# Patient Record
Sex: Female | Born: 1965 | Race: White | Hispanic: No | Marital: Married | Smoking: Never smoker
Health system: Southern US, Community
[De-identification: ages and names within clinical notes are randomized; demographics above are authoritative.]

## PROBLEM LIST (undated history)

## (undated) DIAGNOSIS — K219 Gastro-esophageal reflux disease without esophagitis: Secondary | ICD-10-CM

## (undated) DIAGNOSIS — I10 Essential (primary) hypertension: Secondary | ICD-10-CM

## (undated) DIAGNOSIS — F419 Anxiety disorder, unspecified: Secondary | ICD-10-CM

## (undated) DIAGNOSIS — IMO0001 Reserved for inherently not codable concepts without codable children: Secondary | ICD-10-CM

## (undated) HISTORY — DX: Anxiety disorder, unspecified: F41.9

## (undated) HISTORY — DX: Essential (primary) hypertension: I10

## (undated) HISTORY — DX: Reserved for inherently not codable concepts without codable children: IMO0001

## (undated) HISTORY — DX: Gastro-esophageal reflux disease without esophagitis: K21.9

## (undated) HISTORY — PX: APPENDECTOMY: SHX54

---

## 2016-06-28 ENCOUNTER — Ambulatory Visit (INDEPENDENT_AMBULATORY_CARE_PROVIDER_SITE_OTHER): Payer: BLUE CROSS/BLUE SHIELD | Admitting: Pulmonary Disease

## 2016-06-28 ENCOUNTER — Ambulatory Visit (INDEPENDENT_AMBULATORY_CARE_PROVIDER_SITE_OTHER)
Admission: RE | Admit: 2016-06-28 | Discharge: 2016-06-28 | Disposition: A | Discharge status: Home / Self Care | Payer: BLUE CROSS/BLUE SHIELD | Source: Ambulatory Visit | Attending: Pulmonary Disease | Admitting: Pulmonary Disease

## 2016-06-28 ENCOUNTER — Encounter: Payer: Self-pay | Admitting: Pulmonary Disease

## 2016-06-28 VITALS — BP 156/90 | HR 75 | Ht 68.0 in | Wt 290.0 lb

## 2016-06-28 DIAGNOSIS — R0689 Other abnormalities of breathing: Secondary | ICD-10-CM

## 2016-06-28 DIAGNOSIS — R0600 Dyspnea, unspecified: Secondary | ICD-10-CM

## 2016-06-28 MED ORDER — BUDESONIDE-FORMOTEROL FUMARATE 80-4.5 MCG/ACT IN AERO: 2.0000 | INHALATION_SPRAY | Freq: Two times a day (BID) | RESPIRATORY_TRACT | 0 refills | Status: DC

## 2016-06-28 NOTE — Patient Instructions (Signed)
We will get a chest x-ray today and start you on Symbicort inhaler. We will need to get records of all the outpatient workup from Dr. Mikey BussingHoffman.  Return to clinic in 1 month to review these results and plan for further workup as needed.

## 2016-06-28 NOTE — Progress Notes (Signed)
Patient seen in the office today and instructed on use of Symbicort 80-4.635mcg.  Patient expressed understanding and demonstrated technique. Boone MasterJessica Jones, [facility]CMA 06/28/16

## 2016-06-28 NOTE — Progress Notes (Signed)
Nichole Savage    [id number]    06-Jul-1966  Primary Care Physician:HOFFMAN,BYRON, MD  Referring Physician: No referring provider defined for this encounter.  Chief complaint:  Consult for evaluation of dyspnea.  HPI: Nichole Savage is a 49 year old with past medical history of anxiety, hypertension. She complains of dyspnea for the past 1 year. This occurs both at exertion and at rest it is associated with wheezing, cough with daily mucus production which is white/yellow in consistency. She is a lifelong nonsmoker. [place] and has been inactive, gained about 120 pounds since then. She states had a recent diagnosis of sleep apnea and is waiting to be started on CPAP therapy. She's had an evaluation by cardiology for dyspnea and reports a normal studies. She also reportedly had pulmonary function tests last month. I do not have these reports to review.  She does not have any symptoms of sinusitis, allergies, postnasal drip, GERD. She is a lifelong nonsmoker. 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 She does not have any pets such as cats, dogs, birds, farm animals. Her son used to own a pet rabbit but not for the past 5 years. She does not have any mold issues at home, no hot tubs, no carpets.   Outpatient Encounter Prescriptions as of 06/28/2016  Medication Sig  . clonazePAM (KLONOPIN) 0.5 MG tablet Take 0.5 mg by mouth daily as needed.  Marland Kitchen lisinopril (PRINIVIL,ZESTRIL) 5 MG tablet Take 5 mg by mouth daily.  Marland Kitchen PARoxetine (PAXIL) 40 MG tablet Take 40 mg by mouth daily.  . triazolam (HALCION) 0.25 MG tablet Take before dental appointments   No facility-administered encounter medications on file as of 06/28/2016.     Allergies as of 06/28/2016 - Review Complete 06/28/2016  Allergen Reaction Noted  . Amoxicillin Itching 05/03/2013    Past Medical History:  Diagnosis  Date  . Anxiety   . High blood pressure   . Reflux     Past Surgical History:  Procedure Laterality Date  . APPENDECTOMY      History reviewed. No pertinent family history.  Social History   Social History  . Marital status: Married    Spouse name: [name]  . Number of children: [name]  . Years of education: [name]   Occupational History  . Not on file.   Social History Main Topics  . Smoking status: Never Smoker  . Smokeless tobacco: Never Used  . Alcohol use No  . Drug use: Unknown  . Sexual activity: Not on file   Other Topics Concern  . Not on file   Social History Narrative  . No narrative on file     Review of systems: Review of Systems  Constitutional: Negative for fever and chills.  HENT: Negative.   Eyes: Negative for blurred vision.  Respiratory: as per HPI  Cardiovascular: Negative for chest pain and palpitations.  Gastrointestinal: Negative for vomiting, diarrhea, blood per rectum. Genitourinary: Negative for dysuria, urgency, frequency and hematuria.  Musculoskeletal: Negative for myalgias, back pain and joint pain.  Skin: Negative for itching and rash.  Neurological: Negative for dizziness, tremors, focal weakness, seizures and loss of consciousness.  Endo/Heme/Allergies: Negative for environmental allergies.  Psychiatric/Behavioral: Negative for depression, suicidal ideas and hallucinations.  All other systems reviewed and are negative.   Physical Exam: Pulse 75, height 5\' 8"  (1.727 m), weight  290 lb (131.5 kg), SpO2 97 %. Gen:      No acute distress, Obese HEENT:  EOMI, sclera anicteric Neck:     No masses; no thyromegaly Lungs:    Clear to auscultation bilaterally; normal respiratory effort CV:         Regular rate and rhythm; no murmurs Abd:      + bowel sounds; soft, non-tender; no palpable masses, no distension Ext:    No edema; adequate peripheral perfusion Skin:      Warm and dry; no rash Neuro: alert and oriented x 3 Psych: normal mood  and affect  Data Reviewed:   Assessment:  Evaluation of dyspnea on exertion.  Her symptoms sound like chronic bronchitis. She may have a component of COPD however she is a lifelong nonsmoker. There is no suggestion of asthma, allergic component to this. I suspect her weight gain over the past 10 years is contributing to her symptoms. She's had a cardiac eval and pulmonary function tests recently. I'll try to obtain this from her primary care physician. She will get a chest x-ray and started empirically on Symbicort inhaler.  If her symptoms continue unabated we can consider a cardiopulmonary exercise test for further investigation.  Plan/Recommendations: - Obtain records of cardiac eval and PFTs from PCP - Check CXR - Start symbicort.  Return in 1 month.  Chilton GreathousePraveen Mekiah Cambridge MD [facility] Pager [phone] 06/28/2016, 4:26 PM  CC: No ref. provider found

## 2016-07-02 NOTE — Progress Notes (Signed)
ATC pt, automated message that voicemail has not been set up yet.  WCB.

## 2016-07-12 NOTE — Progress Notes (Signed)
ATC pt x3 - no answer and VM has not been set up yet.  Pt is active in MyChart, results released to her.

## 2016-08-03 ENCOUNTER — Ambulatory Visit: Payer: BLUE CROSS/BLUE SHIELD | Admitting: Pulmonary Disease

## 2022-01-12 ENCOUNTER — Other Ambulatory Visit: Payer: Self-pay

## 2022-01-12 ENCOUNTER — Encounter: Payer: Self-pay | Admitting: Pulmonary Disease

## 2022-01-12 ENCOUNTER — Ambulatory Visit (INDEPENDENT_AMBULATORY_CARE_PROVIDER_SITE_OTHER): Payer: BC Managed Care – PPO | Admitting: Pulmonary Disease

## 2022-01-12 ENCOUNTER — Ambulatory Visit (INDEPENDENT_AMBULATORY_CARE_PROVIDER_SITE_OTHER): Payer: BC Managed Care – PPO

## 2022-01-12 VITALS — BP 118/86 | HR 95 | Ht 68.0 in | Wt 304.0 lb

## 2022-01-12 DIAGNOSIS — G4733 Obstructive sleep apnea (adult) (pediatric): Secondary | ICD-10-CM | POA: Diagnosis not present

## 2022-01-12 DIAGNOSIS — R0602 Shortness of breath: Secondary | ICD-10-CM | POA: Diagnosis not present

## 2022-01-12 DIAGNOSIS — Z6841 Body Mass Index (BMI) 40.0 and over, adult: Secondary | ICD-10-CM

## 2022-01-12 MED ORDER — ALBUTEROL SULFATE HFA 108 (90 BASE) MCG/ACT IN AERS: 2.0000 | INHALATION_SPRAY | Freq: Four times a day (QID) | RESPIRATORY_TRACT | 6 refills | Status: AC | PRN

## 2022-01-12 MED ORDER — BUDESONIDE-FORMOTEROL FUMARATE 80-4.5 MCG/ACT IN AERO: 2.0000 | INHALATION_SPRAY | Freq: Two times a day (BID) | RESPIRATORY_TRACT | 0 refills | Status: AC

## 2022-01-12 NOTE — Patient Instructions (Signed)
Start symbicort inhaler 2 puffs twice daily ?- rinse mouth out after each use ? ?Use albuterol inhaler 1-2 puffs every 4-6 hours as needed for shortness of breath ? ?We will have you follow up in 4-8 weeks for pulmonary function tests ?

## 2022-01-12 NOTE — Progress Notes (Signed)
? ?Synopsis: Referred in March 2023 for shortness of breath by Lindwood QuaByron Hoffman, MD ? ?Subjective:  ? ?PATIENT ID: Nichole Savage GENDER: female DOB: 1966-06-24, MRN: [id number] ? ? ?HPI ? ?Chief Complaint  ?Patient presents with  ? Consult  ?  Referred by Surgeon for increased SOB for the past 4 years.   ? ?Nichole Savage is a 56 year old woman, never smoker with hypertension, GERD and anxiety who is referred to pulmonary clinic for shortness of breath.  ? ?Patient is planning to have surgery for anal fissure repair in the near future and require's pre-op pulmonary evaluation. She reports shortness of breath for many years that started with significant weight gain. She has been dealing with depression since her mother passed away in 2014 when she previously weighed 160lbs and is currently weighing 304lbs. She has shortness of breath mainly with exertion. She was evaluated in 2017 at our clinic by Dr. Isaiah SergeMannam where she was provided symbicort 80-4.675mcg 2 puffs twice daily. She has been using this inhaler as needed with some relief in her dyspnea. She does report increased wheezing and cough over the past 6-8 months with some whitish/clear phlegm. She denies issues with weather changes or seasonal allergies that affect her breathing.  ? ?She has history of moderate sleep apnea with AHI 17.8/hr but is unable to tolerate CPAP therapy. [facility] She denies night time awakenings due to shortness of breath.  ? ?[facility], echo and heart cath. She has mild coronary disease.  ? ?She is a never smoker and denies second hand smoke exposure. No family history of lung disease. She is not currently working. Denies any dust or chemical exposures from her previous work.  ? ?Past Medical History:  ?Diagnosis Date  ? Anxiety   ? High blood pressure   ? Reflux   ?  ? ?No family history on file.  ? ?Social History  ? ?Socioeconomic History  ?  Marital status: Married  ?  Spouse name: Not on file  ? Number of children: Not on file  ? Years of education: Not on file  ? Highest education level: Not on file  ?Occupational History  ? Not on file  ?Tobacco Use  ? Smoking status: Never  ? Smokeless tobacco: Never  ?Substance and Sexual Activity  ? Alcohol use: No  ? Drug use: Not on file  ? Sexual activity: Not on file  ?Other Topics Concern  ? Not on file  ?Social History Narrative  ? Not on file  ? ?Social Determinants of Health  ? ?Financial Resource Strain: Not on file  ?Food Insecurity: Not on file  ?Transportation Needs: Not on file  ?Physical Activity: Not on file  ?Stress: Not on file  ?Social Connections: Not on file  ?Intimate Partner Violence: Not on file  ?  ? ?Allergies  ?Allergen Reactions  ? Amoxicillin Itching  ?  "all of the Cillin family"  ?  ? ?Outpatient Medications Prior to Visit  ?Medication Sig Dispense Refill  ? clonazePAM (KLONOPIN) 0.5 MG tablet Take 0.5 mg by mouth daily as needed.    ? lisinopril (ZESTRIL) 40 MG tablet 1 tablet    ? budesonide-formoterol (SYMBICORT) 80-4.5 MCG/ACT inhaler Inhale 2 puffs into the lungs 2 (two) times daily. 1 Inhaler 0  ? hydrochlorothiazide (HYDRODIURIL) 12.5 MG tablet Take by mouth.    ? pantoprazole (PROTONIX) 40 MG tablet Take  40 mg by mouth 2 (two) times daily as needed.    ? PARoxetine (PAXIL) 40 MG tablet Take 40 mg by mouth daily.    ? lisinopril (PRINIVIL,ZESTRIL) 5 MG tablet Take 5 mg by mouth daily.    ? triazolam (HALCION) 0.25 MG tablet Take before dental appointments    ? ?No facility-administered medications prior to visit.  ? ? ?Review of Systems  ?Constitutional:  Negative for chills, fever, malaise/fatigue and weight loss.  ?HENT:  Negative for congestion, sinus pain and sore throat.   ?Eyes: Negative.   ?Respiratory:  Positive for cough and shortness of breath. Negative for hemoptysis, sputum production and wheezing.   ?Cardiovascular:  Negative for chest pain, palpitations,  orthopnea, claudication and leg swelling.  ?Gastrointestinal:  Negative for abdominal pain, heartburn, nausea and vomiting.  ?Genitourinary: Negative.   ?Musculoskeletal:  Negative for joint pain and myalgias.  ?Skin:  Negative for rash.  ?Neurological:  Negative for weakness.  ?Endo/Heme/Allergies: Negative.   ?Psychiatric/Behavioral: Negative.    ? ?Objective:  ? ?Vitals:  ? 01/12/22 1331  ?BP: 118/86  ?Pulse: 95  ?SpO2: 96%  ?Weight: (!) 304 lb (137.9 kg)  ?Height: 5\' 8"  (1.727 m)  ? ? ? ?Physical Exam ?Constitutional:   ?   General: She is not in acute distress. ?   Appearance: She is obese. She is not ill-appearing.  ?HENT:  ?   Head: Normocephalic and atraumatic.  ?Eyes:  ?   General: No scleral icterus. ?   Conjunctiva/sclera: Conjunctivae normal.  ?   Pupils: Pupils are equal, round, and reactive to light.  ?Cardiovascular:  ?   Rate and Rhythm: Normal rate and regular rhythm.  ?   Pulses: Normal pulses.  ?   Heart sounds: Normal heart sounds. No murmur heard. ?Pulmonary:  ?   Effort: Pulmonary effort is normal.  ?   Breath sounds: Normal breath sounds. No wheezing, rhonchi or rales.  ?Abdominal:  ?   General: Bowel sounds are normal.  ?   Palpations: Abdomen is soft.  ?Musculoskeletal:  ?   Right lower leg: No edema.  ?   Left lower leg: No edema.  ?Lymphadenopathy:  ?   Cervical: No cervical adenopathy.  ?Skin: ?   General: Skin is warm and dry.  ?Neurological:  ?   General: No focal deficit present.  ?   Mental Status: She is alert.  ?Psychiatric:     ?   Mood and Affect: Mood normal.     ?   Behavior: Behavior normal.     ?   Thought Content: Thought content normal.     ?   Judgment: Judgment normal.  ? ?CBC ?No results found for: WBC, RBC, HGB, HCT, PLT, MCV, MCH, MCHC, RDW, LYMPHSABS, MONOABS, EOSABS, BASOSABS ? ? ?Chest imaging: ?CXR 01/12/22 ?No opacities or pleural effusions noted.  ? ?CT Abdomen/Pelvis 11/18/20 ?Lower chest: No acute abnormality.  ? ?CXR 06/28/16 ?Heart is borderline enlarged. Lungs  are clear. No effusions or ?edema. No acute bony abnormality. ? ?PFT: ?   ? View : No data to display.  ?  ?  ?  ? ? ?Labs from [facility] reviewed: ?11/04/21 - CBC ?11/04/21 - CMP, CO2 23 ?11/04/21 - TSH  ?Path: ? ?Echo 01/03/20: ?1. The left ventricle is normal in size with mildly increased wall  ?thickness.  ?  2. The left ventricular systolic function is normal, LVEF is visually  ?estimated at 60-65%.  ?  3. The right ventricle is  mildly dilated in size.  ?  4. The right atrium is mildly dilated  in size.  ? ?Heart Catheterization 06/29/21: ?Mild coronary artery disease ?Normal LV filling pressures ? ?NM Myocardial Perfusion Scan 01/25/20 ?- No evidence for significant ischemia or scar is noted.  ?- There is a small in size mild in severity, partially reversible defect involving apical and apical anterior segments. This is compatible with possible artifact or mild ischemia.  ?- During stress: Global systolic function is normal. The ejection fraction was greater than 65%.  ?- Mild coronary calcifications are likely present. Evaluation is limited due to body habitus.  ?   ?Assessment & Plan:  ? ?Shortness of breath - Plan: budesonide-formoterol (SYMBICORT) 80-4.5 MCG/ACT inhaler, albuterol (VENTOLIN HFA) 108 (90 Base) MCG/ACT inhaler, Pulmonary Function Test, DG Chest 2 View ? ?Class 3 severe obesity due to excess calories with body mass index (BMI) of 45.0 to 49.9 in adult, unspecified whether serious comorbidity present (HCC) ? ?Discussion: ?Nichole Savage is a 56 year old woman, never smoker with hypertension, GERD and anxiety who is referred to pulmonary clinic for shortness of breath.  ? ?Her dyspnea is multifactorial in setting of severe obesity, deconditioning and possible reactive airways disease given her symptoms of cough and wheezing.  ? ?We discussed the importance weight loss and recommended starting with limiting her soda intake as she drinks 3, 16oz bottles per day.  ? ?We also reviewed the  significance of sleep apnea and the detrimental effects if left untreated which include hypertension, heart arrhythmias, heart failure and stroke. I also encouraged weight loss for further treatment of this. Her serum bicarb

## 2022-01-13 ENCOUNTER — Encounter: Payer: Self-pay | Admitting: Pulmonary Disease

## 2022-03-16 ENCOUNTER — Ambulatory Visit: Payer: BC Managed Care – PPO | Admitting: Pulmonary Disease

## 2022-08-05 ENCOUNTER — Other Ambulatory Visit (HOSPITAL_COMMUNITY): Payer: Self-pay | Admitting: Internal Medicine

## 2022-08-05 DIAGNOSIS — G8929 Other chronic pain: Secondary | ICD-10-CM

## 2022-08-05 DIAGNOSIS — R202 Paresthesia of skin: Secondary | ICD-10-CM

## 2022-09-29 ENCOUNTER — Other Ambulatory Visit (HOSPITAL_COMMUNITY): Payer: Self-pay | Admitting: Internal Medicine

## 2022-09-29 DIAGNOSIS — R202 Paresthesia of skin: Secondary | ICD-10-CM

## 2022-09-29 DIAGNOSIS — M5416 Radiculopathy, lumbar region: Secondary | ICD-10-CM

## 2022-10-06 ENCOUNTER — Ambulatory Visit
Admission: RE | Admit: 2022-10-06 | Discharge: 2022-10-06 | Disposition: A | Discharge status: Home / Self Care | Payer: BC Managed Care – PPO | Source: Ambulatory Visit | Attending: Internal Medicine | Admitting: Internal Medicine

## 2022-10-06 DIAGNOSIS — M5416 Radiculopathy, lumbar region: Secondary | ICD-10-CM | POA: Insufficient documentation

## 2022-10-06 DIAGNOSIS — R202 Paresthesia of skin: Secondary | ICD-10-CM | POA: Insufficient documentation

## 2022-11-02 IMAGING — DX DG CHEST 2V
2 series · 2 of 2 positions shown · non-contrast
Comparison: Chest radiograph 02/24/2021.

CLINICAL DATA: Shortness of breath.

EXAM:
CHEST - 2 VIEW

[chest pa]
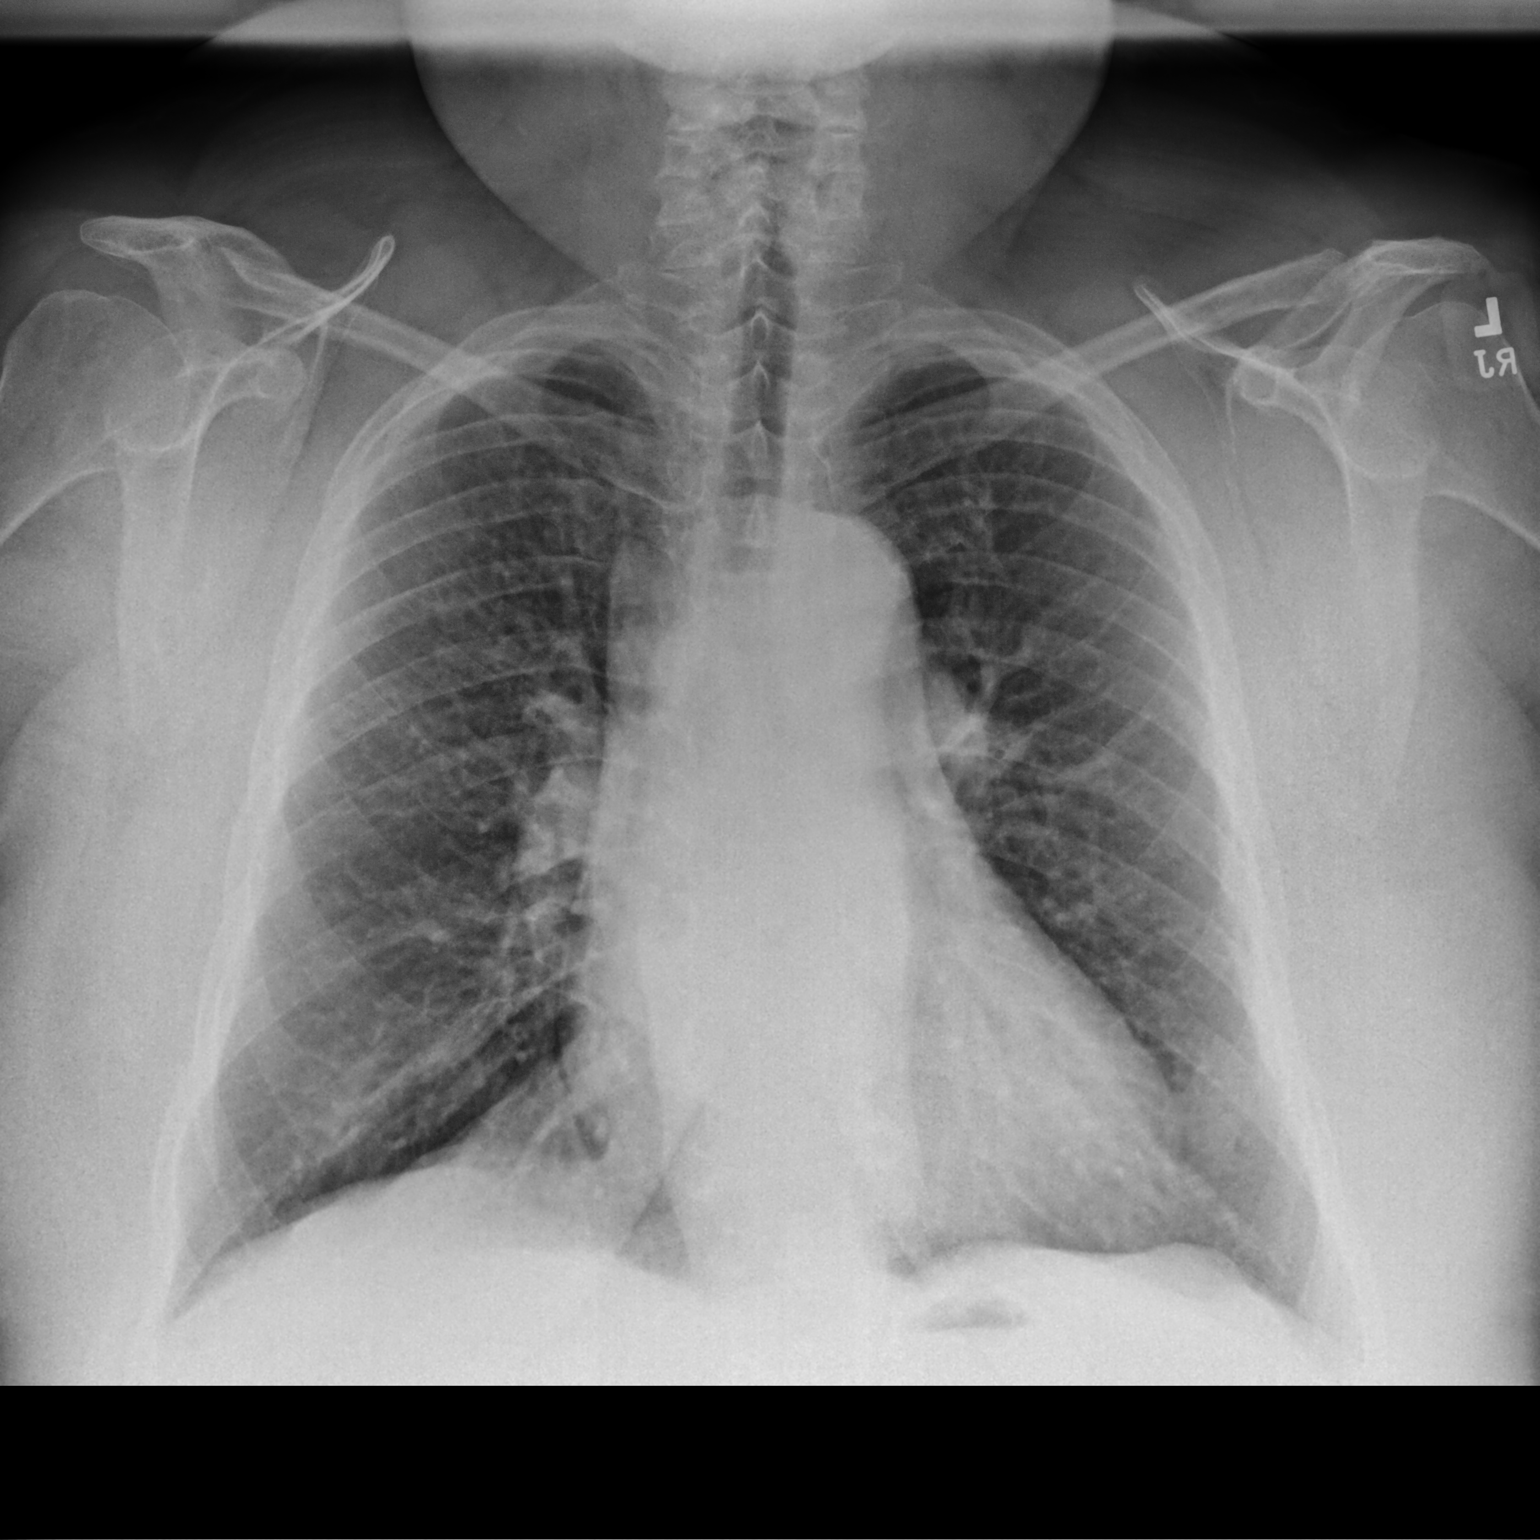

[chest lat]
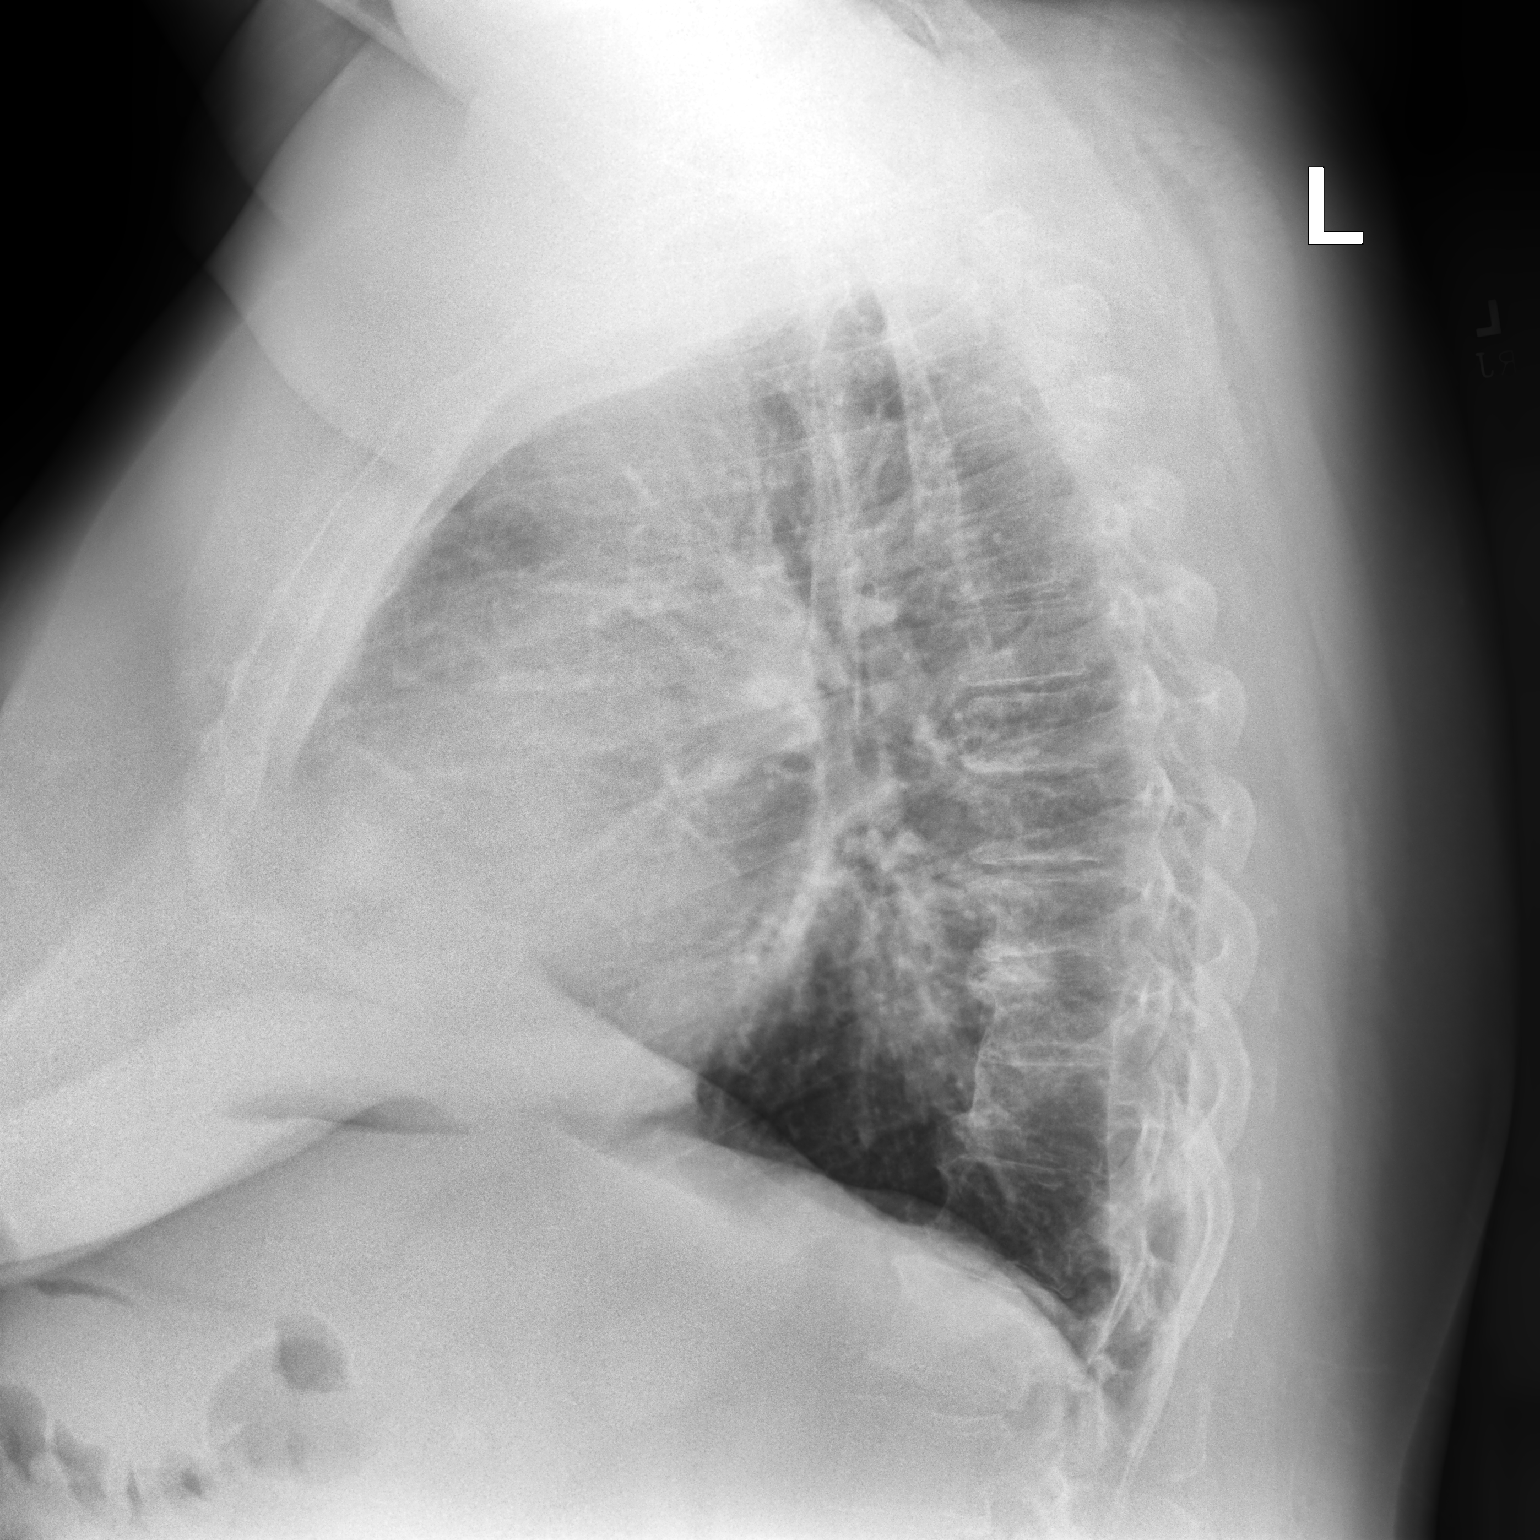

[2 of 2 positions shown; findings below may reference images not displayed]

FINDINGS: Stable cardiac and mediastinal contours. No consolidative pulmonary
opacities. No pleural effusion or pneumothorax. Thoracic spine
degenerative changes.
IMPRESSION: No active cardiopulmonary disease.

## 2023-07-22 ENCOUNTER — Other Ambulatory Visit: Payer: Self-pay | Admitting: Gastroenterology

## 2023-07-22 DIAGNOSIS — K625 Hemorrhage of anus and rectum: Secondary | ICD-10-CM

## 2023-07-22 DIAGNOSIS — R1084 Generalized abdominal pain: Secondary | ICD-10-CM

## 2023-07-22 DIAGNOSIS — K5904 Chronic idiopathic constipation: Secondary | ICD-10-CM

## 2023-07-25 ENCOUNTER — Other Ambulatory Visit: Payer: Self-pay | Admitting: Gastroenterology

## 2023-07-25 ENCOUNTER — Ambulatory Visit
Admission: RE | Admit: 2023-07-25 | Discharge: 2023-07-25 | Disposition: A | Discharge status: Home / Self Care | Payer: 59 | Source: Ambulatory Visit | Attending: Gastroenterology | Admitting: Gastroenterology

## 2023-07-25 DIAGNOSIS — K5904 Chronic idiopathic constipation: Secondary | ICD-10-CM

## 2023-07-27 ENCOUNTER — Other Ambulatory Visit: Payer: Self-pay | Admitting: Gastroenterology

## 2023-07-27 ENCOUNTER — Ambulatory Visit
Admission: RE | Admit: 2023-07-27 | Discharge: 2023-07-27 | Disposition: A | Discharge status: Home / Self Care | Payer: 59 | Source: Ambulatory Visit | Attending: Gastroenterology | Admitting: Gastroenterology

## 2023-07-27 DIAGNOSIS — R1084 Generalized abdominal pain: Secondary | ICD-10-CM

## 2023-07-27 DIAGNOSIS — K5904 Chronic idiopathic constipation: Secondary | ICD-10-CM

## 2023-07-27 DIAGNOSIS — K625 Hemorrhage of anus and rectum: Secondary | ICD-10-CM

## 2023-07-27 MED ORDER — IOPAMIDOL (ISOVUE-300) INJECTION 61%
500.0000 mL | Freq: Once | INTRAVENOUS | Status: AC | PRN
  Administered 2023-07-27: 100 mL via INTRAVENOUS

## 2023-07-29 ENCOUNTER — Ambulatory Visit
Admission: RE | Admit: 2023-07-29 | Discharge: 2023-07-29 | Disposition: A | Discharge status: Home / Self Care | Payer: 59 | Source: Ambulatory Visit | Attending: Gastroenterology | Admitting: Gastroenterology

## 2023-07-29 ENCOUNTER — Other Ambulatory Visit: Payer: Self-pay | Admitting: Gastroenterology

## 2023-07-29 DIAGNOSIS — K5904 Chronic idiopathic constipation: Secondary | ICD-10-CM
